# Patient Record
Sex: Male | Born: 1935 | Race: White | Hispanic: No | Marital: Married | Smoking: Former smoker
Health system: Southern US, Community
[De-identification: ages and names within clinical notes are randomized; demographics above are authoritative.]

## PROBLEM LIST (undated history)

## (undated) DIAGNOSIS — J8410 Pulmonary fibrosis, unspecified: Secondary | ICD-10-CM

## (undated) DIAGNOSIS — E119 Type 2 diabetes mellitus without complications: Secondary | ICD-10-CM

## (undated) DIAGNOSIS — J189 Pneumonia, unspecified organism: Secondary | ICD-10-CM

## (undated) DIAGNOSIS — J9621 Acute and chronic respiratory failure with hypoxia: Secondary | ICD-10-CM

## (undated) DIAGNOSIS — I82409 Acute embolism and thrombosis of unspecified deep veins of unspecified lower extremity: Secondary | ICD-10-CM

## (undated) DIAGNOSIS — I2510 Atherosclerotic heart disease of native coronary artery without angina pectoris: Secondary | ICD-10-CM

## (undated) DIAGNOSIS — E079 Disorder of thyroid, unspecified: Secondary | ICD-10-CM

## (undated) DIAGNOSIS — J449 Chronic obstructive pulmonary disease, unspecified: Secondary | ICD-10-CM

## (undated) DIAGNOSIS — E785 Hyperlipidemia, unspecified: Secondary | ICD-10-CM

## (undated) DIAGNOSIS — J4489 Other specified chronic obstructive pulmonary disease: Secondary | ICD-10-CM

## (undated) DIAGNOSIS — I10 Essential (primary) hypertension: Secondary | ICD-10-CM

## (undated) DIAGNOSIS — I219 Acute myocardial infarction, unspecified: Secondary | ICD-10-CM

## (undated) DIAGNOSIS — I5022 Chronic systolic (congestive) heart failure: Secondary | ICD-10-CM

## (undated) DIAGNOSIS — N529 Male erectile dysfunction, unspecified: Secondary | ICD-10-CM

## (undated) HISTORY — PX: CORONARY ARTERY BYPASS GRAFT: SHX141

---

## 2012-07-28 LAB — PSA, DIAGNOSTIC: PSA: 0.1 ng/mL

## 2013-08-01 LAB — PSA, DIAGNOSTIC: PSA: 0.1 ng/mL

## 2014-08-14 ENCOUNTER — Encounter

## 2014-08-14 LAB — PSA, DIAGNOSTIC: PSA: 0.1 ng/mL

## 2014-08-20 LAB — POCT URINALYSIS DIPSTICK W/O MICROSCOPE (AUTO)
Blood, UA POC: NEGATIVE
Glucose, UA POC: NEGATIVE
Leukocytes, UA: NEGATIVE
Nitrite, UA: NEGATIVE
Protein, UA POC: NEGATIVE

## 2014-08-20 NOTE — Progress Notes (Signed)
Tama Headings Sande Brothers, MD FACS    Urology Office Progress Note    Patient:  Craig Gordon  Date of Birth: 08/13/1933  Date: 08/20/2014    HISTORY OF PRESENT ILLNESS:   The patient is a 80 y.o. male who was last seen on 08-09-2013.    Prostate Cancer  Patient is here today for prostate cancer which was first diagnosed several years ago.  His last several PSA values are as follows:  Lab Results   Component Value Date    PSA 0.1 08/14/2014    PSA 0.1 08/01/2013    PSA 0.1 07/28/2012     Previous treatment of prostate cancer: RRP 04/26/00  Lower urinary tract symptoms: frequency and nocturia, 2 times per night  Associated Symptoms: No dysuria, gross hematuria.  Pain Severity: Pain Score:   0 - No pain/10    Summary of old records:   ED: Viagra prn  Left epididymal cyst, 1.2 cm  History of prostate cancer: T1c T2a N0 RRP 04/26/00; bx 03/26/00  History of BNC s/p TUIBNC 07/28/09  Nocturia x2; bothersome, snores a lot    Additional History: No active sexually at this time.  No change in left epididymal cyst.    Procedures Today: N/A    Urinalysis today:  Results for POC orders placed in visit on 08/20/14   POCT URINALYSIS DIPSTICK W/O MICROSCOPE (AUTO)       Result Value Ref Range    Color, UA yellow      Clarity, UA clear      Glucose, UA POC negative      Bilirubin, UA        Ketones, UA        Spec Grav, UA        Blood, UA POC negative      pH, UA        Protein, UA POC negative      Urobilinogen, UA        Leukocytes, UA negative      Nitrite, UA negative       Last several PSA's:  Lab Results   Component Value Date    PSA 0.1 08/14/2014    PSA 0.1 08/01/2013    PSA 0.1 07/28/2012     Last total testosterone:  No results found for this basename: TESTOSTERONE       AUA Symptom Score (08/20/2014):  INCOMPLETE EMPTYING: How often have you had the sensation of not emptying your bladder?: Not at all  FREQUENCY: How often do you have to urinate less than every two hours?: About Half the time  INTERMITTENCY: How often have you found you  stopped and started again several times when you urinated?: Not at all  URGENCY: How often have you found it difficult to postpone urination?: Not at all  WEAK STREAM: How often have you had a weak urinary stream?: Not at all  STRAINING: How often have you had to strain to start  urination?: Not at all  NOCTURIA: How many times did you typically get up at night to uriniate?: 2 Times  TOTAL I-PSS SCORE:: 5  How would you feel if you were to spend the rest of your life with your urinary condition?: Mostly Satisfied    Last AUA Symptom Score (QOL): 4 (2)    Last BUN and creatinine:  No results found for this basename: BUN     No results found for this basename: CREATININE       Additional Lab/Culture results: none  Imaging Reviewed during this Office Visit: none  (results were independently reviewed by physician and radiology report verified)    PAST MEDICAL, FAMILY AND SOCIAL HISTORY UPDATE:  Past Medical History   Diagnosis Date   ??? Erectile dysfunction    ??? Personal history of malignant neoplasm of prostate    ??? Hypertension    ??? Mumps    ??? Hypertrophy of prostate with urinary obstruction and other lower urinary tract symptoms (LUTS)    ??? Caffeine use      3 COFFEE/DAY     Past Surgical History   Procedure Laterality Date   ??? Colonoscopy     ??? Vasectomy     ??? Bladder surgery       TUIBNC 2001   ??? Prostatectomy       2001   ??? Cystoscopy     ??? Prostate biopsy       Family History   Problem Relation Age of Onset   ??? Cancer Father      PROSTATE CANCER     Current Outpatient Prescriptions   Medication Sig Dispense Refill   ??? levothyroxine (SYNTHROID) 100 MCG tablet Take by mouth Daily       ??? amLODIPine (NORVASC) 10 MG tablet Take 10 mg by mouth daily       ??? bisoprolol-hydrochlorothiazide (ZIAC) 2.5-6.25 MG per tablet Take 1 tablet by mouth daily       ??? sildenafil (VIAGRA) 100 MG tablet Take 100 mg by mouth as needed for Erectile Dysfunction         No current facility-administered medications for this visit.        Review of patient's allergies indicates no known allergies.  History   Smoking status   ??? Former Smoker -- 1.00 packs/day for 25 years   Smokeless tobacco   ??? Not on file     Comment: QUIT 1979     (If patient a smoker, smoking cessation counseling offered)    History   Alcohol Use   ??? Yes       REVIEW OF SYSTEMS:  Constitutional: negative  Eyes: negative  Neurological: negative  Endocrine: negative  Gastrointestinal: negative  Cardiovascular: negative  Skin: negative   Musculoskeletal: negative  Ears/Nose/Throat: positive for loss of smell  Respiratory: negative  Hematological/Lymphatic: negative  Psychological: negative    Physical Exam:      Filed Vitals:    08/20/14 1033   BP: 170/82   Pulse: 51     Body mass index is 28.14 kg/(m^2).  Patient is a 80 y.o. male in no acute distress and alert and oriented to person, place and time.      Assessment and Plan      1. Nocturia    2. Impotence of organic origin    3. Epididymal cyst    4. Personal history of malignant neoplasm of prostate           Plan:      Return in about 1 year (around 08/21/2015) for follow up, PSA.  No evidence of prostate cancer recurrence.   No active sexually at this time; no Rx needed for ED.    No change in left epididymal cyst.  Patient was instructed to keep a voiding diary for 3 days in order to document urine output, frequency of urination and functional bladder capacity.  I suspect he has nocturnal polyuria due to obstructive sleep apnea.  If voiding diary confirms this, he will need a sleep evaluation.  Tama Headings Dawt Reeb, MD FACS  08/20/2014 10:56 AM    2015 PQRS Documentation: Patient already has diagnosis of or is currently being treated for Hypertension.Patient does not have an advance plan and does not want to name a durable power of attorney at this time.

## 2015-08-21 LAB — PSA, DIAGNOSTIC: PSA: 0.1 ng/mL

## 2015-08-23 ENCOUNTER — Encounter: Attending: Specialist

## 2015-08-30 ENCOUNTER — Ambulatory Visit: Admit: 2015-08-30 | Discharge: 2015-08-30 | Payer: MEDICARE | Attending: Specialist

## 2015-08-30 DIAGNOSIS — R351 Nocturia: Secondary | ICD-10-CM

## 2015-08-30 LAB — POCT URINALYSIS DIPSTICK W/O MICROSCOPE (AUTO)
Blood, UA POC: NEGATIVE
Glucose, UA POC: NEGATIVE
Leukocytes, UA: NEGATIVE
Nitrite, UA: NEGATIVE
Protein, UA POC: NEGATIVE

## 2015-08-30 NOTE — Progress Notes (Signed)
Tama Headings Sande Brothers, MD FACS    Urology Office Progress Note    Patient:  Craig Gordon  Date of Birth: Jun 05, 1933  Date: 08/30/2015    HISTORY OF PRESENT ILLNESS:   The patient is a 81 y.o. male who was last seen on 08/20/14.    Prostate Cancer  Patient is here today for prostate cancer which was first diagnosed several years ago.  His last several PSA values are as follows:  Lab Results   Component Value Date    PSA 0.1 08/21/2015    PSA 0.1 08/14/2014    PSA 0.1 08/01/2013     Previous treatment of prostate cancer: RRP 04/26/00  Lower urinary tract symptoms: nocturia, 2 times per night  Associated Symptoms: No dysuria, gross hematuria.  Pain Severity: Pain Score:   0 - No pain/10    Summary of old records:   ED: Viagra prn  Left epididymal cyst, 1.2 cm  History of prostate cancer: T1c T2a N0 RRP 04/26/00; bx 03/26/00  History of BNC s/p TUIBNC 07/28/09  Nocturia x2; bothersome, snores a lot; 09/12/14 VD: MVV=450 mL, TVV=1892 mL, TVV=7.33, AVV=258 mL/void, Nx2.33, NPi=44.49%, NUP=142 mL/hr    Additional History: Patient instructed to limit fluid intake 2-3 hours prior to bedtime to minimize nocturia or nocturnal incontinence. Not interested in medical Rx for ED since patient not sexually active.      Procedures Today: N/A    Urinalysis today:  Results for POC orders placed in visit on 08/30/15   POCT Urinalysis No Micro (Auto)   Result Value Ref Range    Color, UA yellow     Clarity, UA clear     Glucose, UA POC negative     Bilirubin, UA      Ketones, UA      Spec Grav, UA      Blood, UA POC negative     pH, UA      Protein, UA POC negative     Urobilinogen, UA      Leukocytes, UA negative     Nitrite, UA negative      Last several PSA's:  Lab Results   Component Value Date    PSA 0.1 08/21/2015    PSA 0.1 08/14/2014    PSA 0.1 08/01/2013     Last total testosterone:  No results found for: TESTOSTERONE    AUA Symptom Score (08/30/2015):  INCOMPLETE EMPTYING: How often have you had the sensation of not emptying your  bladder?: Not at all  FREQUENCY: How often do you have to urinate less than every two hours?: Less than 1 to 5 times  INTERMITTENCY: How often have you found you stopped and started again several times when you urinated?: Not at all  URGENCY: How often have you found it difficult to postpone urination?: Not at all  WEAK STREAM: How often have you had a weak urinary stream?: Not at all  STRAINING: How often have you had to strain to start  urination?: Not at all  NOCTURIA: How many times did you typically get up at night to uriniate?: 2 Times  TOTAL I-PSS SCORE:: 3  How would you feel if you were to spend the rest of your life with your urinary condition?: Pleased    Last AUA Symptom Score (QOL): 5 (2)    Last BUN and creatinine:  No results found for: BUN  No results found for: CREATININE    Additional Lab/Culture results: none    Imaging Reviewed during this  Office Visit: none  (results were independently reviewed by physician and radiology report verified)    PAST MEDICAL, FAMILY AND SOCIAL HISTORY UPDATE:  Past Medical History   Diagnosis Date   ??? Caffeine use      3 COFFEE/DAY   ??? Erectile dysfunction    ??? Hypertension    ??? Hypertrophy of prostate with urinary obstruction and other lower urinary tract symptoms (LUTS)    ??? Mumps    ??? Personal history of malignant neoplasm of prostate      Past Surgical History   Procedure Laterality Date   ??? Colonoscopy     ??? Vasectomy     ??? Bladder surgery       TUIBNC 2001   ??? Prostatectomy       2001   ??? Cystoscopy     ??? Prostate biopsy       Family History   Problem Relation Age of Onset   ??? Cancer Father      PROSTATE CANCER     Current Outpatient Prescriptions   Medication Sig Dispense Refill   ??? levothyroxine (SYNTHROID) 100 MCG tablet Take by mouth Daily     ??? amLODIPine (NORVASC) 10 MG tablet Take 10 mg by mouth daily     ??? bisoprolol-hydrochlorothiazide (ZIAC) 2.5-6.25 MG per tablet Take 1 tablet by mouth daily     ??? sildenafil (VIAGRA) 100 MG tablet Take 100 mg by  mouth as needed for Erectile Dysfunction       No current facility-administered medications for this visit.        Review of patient's allergies indicates no known allergies.  History   Smoking Status   ??? Former Smoker   ??? Packs/day: 1.00   ??? Years: 25.00   Smokeless Tobacco   ??? Not on file     Comment: QUIT 1979     (If patient a smoker, smoking cessation counseling offered)    History   Alcohol Use   ??? Yes       REVIEW OF SYSTEMS:  Constitutional: negative  Eyes: negative  Neurological: negative  Endocrine: negative  Gastrointestinal: negative  Cardiovascular: negative  Skin: negative   Musculoskeletal: negative  Ears/Nose/Throat: positive for loss of smell  Respiratory: negative  Hematological/Lymphatic: negative  Psychological: negative    Physical Exam:      Vitals:    08/30/15 1430   BP: 163/73   Pulse: 51     Body mass index is 27.83 kg/(m^2).  Patient is a 81 y.o. male in no acute distress and alert and oriented to person, place and time.      Assessment and Plan      1. Nocturia    2. Impotence of organic origin    3. Epididymal cyst    4. Personal history of malignant neoplasm of prostate           Plan:      Return in about 1 year (around 08/29/2016) for PSA.  Patient instructed to limit fluid intake 2-3 hours prior to bedtime to minimize nocturia or nocturnal incontinence.   Not interested in medical Rx for ED since patient not sexually active.  No evidence of prostate cancer recurrence.         Tama Headings Craig Meloy, MD FACS  08/30/2015 2:39 PM    2016 PQRS Documentation:   Patient already has diagnosis of or is currently being treated for Hypertension.  Patient does not have an advance plan and does not want  to name a durable power of attorney at this time.

## 2016-08-24 LAB — PSA, DIAGNOSTIC: PSA: 0.1 ng/mL

## 2016-09-02 ENCOUNTER — Ambulatory Visit: Admit: 2016-09-02 | Discharge: 2016-09-02 | Payer: MEDICARE | Attending: Specialist

## 2016-09-02 DIAGNOSIS — R351 Nocturia: Secondary | ICD-10-CM

## 2016-09-02 LAB — POCT URINALYSIS DIPSTICK W/O MICROSCOPE (AUTO)
Blood, UA POC: NEGATIVE
Glucose, UA POC: NEGATIVE
Leukocytes, UA: NEGATIVE
Nitrite, UA: NEGATIVE
Protein, UA POC: NEGATIVE

## 2016-09-02 NOTE — Progress Notes (Signed)
Tama HeadingsGregory D. Sande BrothersHaselhuhn, MD FACS    Urology Office Progress Note    Patient:  Craig DimmerJohnnie Gordon  Date of Birth: 04/20/1933  Date: 09/02/2016    HISTORY OF PRESENT ILLNESS:   The patient is a 82 y.o. male who was last seen on 08/30/15.    Prostate Cancer  Patient is here today for prostate cancer which was first diagnosed several years ago.  He is s/p RRP on 04/26/00.  His last several PSA values are as follows:  Lab Results   Component Value Date    PSA 0.1 08/24/2016    PSA 0.1 08/21/2015    PSA 0.1 08/14/2014     Additional Prostate Cancer Rx: none   Lower urinary tract symptoms: frequency and nocturia, 2 times per night  Stress Urinary Incontinence? No  Number of pads per day: 0   Associated Symptoms: No dysuria, gross hematuria.  Pain Severity: Pain Score:   0 - No pain/10    Summary of old records:   ED: Viagra prn  Left epididymal cyst, 1.2 cm  History of prostate cancer: T1c T2a N0 RRP 04/26/00; bx 03/26/00  History of BNC s/p TUIBNC 07/28/09  Nocturia x2; bothersome, snores a lot; 09/12/14 VD: MVV=450 mL, TVV=1892 mL, TVV=7.33, AVV=258 mL/void, Nx2.33, NPi=44.49%, NUP=142 mL/hr    Additional History: No evidence of prostate cancer recurrence s/p 04/26/00  radical prostatectomy.  At this point, patient does not require any more follow up PSA's.  Patient instructed to limit fluid intake 2-3 hours prior to bedtime to minimize nocturia or nocturnal incontinence.  Not interested in medical Rx for ED since patient not sexually active.     Procedures Today: N/A    Urinalysis today:  Results for POC orders placed in visit on 09/02/16   POCT Urinalysis No Micro (Auto)   Result Value Ref Range    Color, UA Amber     Clarity, UA Clear     Glucose, UA POC Negative     Bilirubin, UA      Ketones, UA      Spec Grav, UA      Blood, UA POC Negative     pH, UA      Protein, UA POC Negative     Urobilinogen, UA      Leukocytes, UA Negative     Nitrite, UA Negative      Last several PSA's:  Lab Results   Component Value Date    PSA 0.1  08/24/2016    PSA 0.1 08/21/2015    PSA 0.1 08/14/2014     Last total testosterone:  No results found for: TESTOSTERONE    AUA Symptom Score (09/02/2016):  INCOMPLETE EMPTYING: How often have you had the sensation of not emptying your bladder?: Not at all  FREQUENCY: How often do you have to urinate less than every two hours?: Less than Half the time  INTERMITTENCY: How often have you found you stopped and started again several times when you urinated?: Not at all  URGENCY: How often have you found it difficult to postpone urination?: Not at all  WEAK STREAM: How often have you had a weak urinary stream?: Not at all  STRAINING: How often have you had to strain to start  urination?: Not at all  NOCTURIA: How many times did you typically get up at night to uriniate?: 2 Times  TOTAL I-PSS SCORE:: 4  How would you feel if you were to spend the rest of your life with your urinary  condition?: Pleased    Last AUA Symptom Score (QOL): 3 (1)    Last BUN and creatinine:  No results found for: BUN  No results found for: CREATININE    Additional Lab/Culture results: none    Imaging Reviewed during this Office Visit: none  (results were independently reviewed by physician and radiology report verified)    PAST MEDICAL, FAMILY AND SOCIAL HISTORY UPDATE:  Past Medical History:   Diagnosis Date   ??? Caffeine use     3 COFFEE/DAY   ??? Erectile dysfunction    ??? Hypertension    ??? Hypertrophy of prostate with urinary obstruction and other lower urinary tract symptoms (LUTS)    ??? Mumps    ??? Personal history of malignant neoplasm of prostate      Past Surgical History:   Procedure Laterality Date   ??? BLADDER SURGERY      TUIBNC 2001   ??? COLONOSCOPY     ??? CYSTOSCOPY     ??? PROSTATE BIOPSY     ??? PROSTATECTOMY      2001   ??? VASECTOMY       Family History   Problem Relation Age of Onset   ??? Cancer Father      PROSTATE CANCER     Current Outpatient Prescriptions   Medication Sig Dispense Refill   ??? fluorouracil (EFUDEX) 5 % cream      ???  levothyroxine (SYNTHROID) 100 MCG tablet Take by mouth Daily     ??? amLODIPine (NORVASC) 10 MG tablet Take 10 mg by mouth daily     ??? bisoprolol-hydrochlorothiazide (ZIAC) 2.5-6.25 MG per tablet Take 1 tablet by mouth daily     ??? sildenafil (VIAGRA) 100 MG tablet Take 100 mg by mouth as needed for Erectile Dysfunction       No current facility-administered medications for this visit.        Review of patient's allergies indicates no known allergies.  History   Smoking Status   ??? Former Smoker   ??? Packs/day: 1.00   ??? Years: 25.00   Smokeless Tobacco   ??? Not on file     Comment: QUIT 1979     (If patient a smoker, smoking cessation counseling offered)    History   Alcohol Use   ??? Yes       REVIEW OF SYSTEMS:  Constitutional: negative  Eyes: positive for  blurred vision  Neurological: negative  Endocrine: negative  Gastrointestinal: negative  Cardiovascular: negative  Skin: positive for pruritus   Musculoskeletal: negative  Ears/Nose/Throat: positive for loss of smell  Respiratory: negative  Hematological/Lymphatic: negative  Psychological: negative    Physical Exam:      Vitals:    09/02/16 1435   BP: (!) 182/78   Pulse: 50     Body mass index is 28.13 kg/(m^2).  Patient is a 82 y.o. male in no acute distress and alert and oriented to person, place and time.      Assessment and Plan      1. Nocturia    2. Impotence of organic origin    3. Personal history of malignant neoplasm of prostate           Plan:      Return if symptoms worsen or fail to improve.  No evidence of prostate cancer recurrence s/p 04/26/00  radical prostatectomy.    At this point, patient does not require any more follow up PSA's.    Patient instructed to limit fluid  intake 2-3 hours prior to bedtime to minimize nocturia or nocturnal incontinence.    Not interested in medical Rx for ED since patient not sexually active.            Tama Headings Craig Lecompte, MD FACS  09/02/2016 2:44 PM    2017 PQRS Documentation:   Patient noted to have have high pressure  as defined as SBP > 140 or DBP > 90 and was referred to PCP for further evaluation and treatment.  Patient does not have an advance plan and does not want to name a durable power of attorney at this time.

## 2018-05-01 ENCOUNTER — Inpatient Hospital Stay
Admission: RE | Admit: 2018-05-01 | Discharge: 2018-05-07 | Disposition: E | Payer: Medicare HMO | Attending: Internal Medicine | Admitting: Internal Medicine

## 2018-05-01 ENCOUNTER — Institutional Professional Consult (permissible substitution): Admit: 2018-05-01 | Payer: Self-pay | Admitting: Internal Medicine

## 2018-05-01 ENCOUNTER — Other Ambulatory Visit (HOSPITAL_COMMUNITY): Payer: Self-pay

## 2018-05-01 DIAGNOSIS — J8410 Pulmonary fibrosis, unspecified: Secondary | ICD-10-CM

## 2018-05-01 DIAGNOSIS — R001 Bradycardia, unspecified: Secondary | ICD-10-CM

## 2018-05-01 DIAGNOSIS — J189 Pneumonia, unspecified organism: Secondary | ICD-10-CM

## 2018-05-01 DIAGNOSIS — I82409 Acute embolism and thrombosis of unspecified deep veins of unspecified lower extremity: Secondary | ICD-10-CM | POA: Diagnosis present

## 2018-05-01 DIAGNOSIS — J449 Chronic obstructive pulmonary disease, unspecified: Secondary | ICD-10-CM | POA: Diagnosis present

## 2018-05-01 DIAGNOSIS — J9621 Acute and chronic respiratory failure with hypoxia: Secondary | ICD-10-CM | POA: Diagnosis present

## 2018-05-01 DIAGNOSIS — J9690 Respiratory failure, unspecified, unspecified whether with hypoxia or hypercapnia: Secondary | ICD-10-CM

## 2018-05-01 DIAGNOSIS — I5033 Acute on chronic diastolic (congestive) heart failure: Secondary | ICD-10-CM | POA: Diagnosis present

## 2018-05-01 DIAGNOSIS — J4489 Other specified chronic obstructive pulmonary disease: Secondary | ICD-10-CM | POA: Diagnosis present

## 2018-05-01 HISTORY — DX: Pulmonary fibrosis, unspecified: J84.10

## 2018-05-01 HISTORY — DX: Chronic systolic (congestive) heart failure: I50.22

## 2018-05-01 HISTORY — DX: Hyperlipidemia, unspecified: E78.5

## 2018-05-01 HISTORY — DX: Disorder of thyroid, unspecified: E07.9

## 2018-05-01 HISTORY — DX: Acute and chronic respiratory failure with hypoxia: J96.21

## 2018-05-01 HISTORY — DX: Other specified chronic obstructive pulmonary disease: J44.89

## 2018-05-01 HISTORY — DX: Pneumonia, unspecified organism: J18.9

## 2018-05-01 HISTORY — DX: Type 2 diabetes mellitus without complications: E11.9

## 2018-05-01 HISTORY — DX: Acute embolism and thrombosis of unspecified deep veins of unspecified lower extremity: I82.409

## 2018-05-01 HISTORY — DX: Chronic obstructive pulmonary disease, unspecified: J44.9

## 2018-05-01 HISTORY — DX: Acute myocardial infarction, unspecified: I21.9

## 2018-05-01 HISTORY — DX: Essential (primary) hypertension: I10

## 2018-05-01 HISTORY — DX: Atherosclerotic heart disease of native coronary artery without angina pectoris: I25.10

## 2018-05-01 LAB — CBC WITH DIFFERENTIAL/PLATELET
ABS IMMATURE GRANULOCYTES: 0.1 10*3/uL (ref 0.0–0.1)
BASOS ABS: 0 10*3/uL (ref 0.0–0.1)
BASOS PCT: 0 %
EOS ABS: 0.3 10*3/uL (ref 0.0–0.7)
Eosinophils Relative: 6 %
HCT: 31.9 % — ABNORMAL LOW (ref 39.0–52.0)
Hemoglobin: 8.9 g/dL — ABNORMAL LOW (ref 13.0–17.0)
Immature Granulocytes: 1 %
Lymphocytes Relative: 18 %
Lymphs Abs: 0.9 10*3/uL (ref 0.7–4.0)
MCH: 25.9 pg — AB (ref 26.0–34.0)
MCHC: 27.9 g/dL — AB (ref 30.0–36.0)
MCV: 92.7 fL (ref 78.0–100.0)
MONO ABS: 0.5 10*3/uL (ref 0.1–1.0)
Monocytes Relative: 10 %
NEUTROS ABS: 3.3 10*3/uL (ref 1.7–7.7)
Neutrophils Relative %: 65 %
PLATELETS: 210 10*3/uL (ref 150–400)
RBC: 3.44 MIL/uL — ABNORMAL LOW (ref 4.22–5.81)
RDW: 15 % (ref 11.5–15.5)
WBC: 5.1 10*3/uL (ref 4.0–10.5)

## 2018-05-01 LAB — BLOOD GAS, ARTERIAL
ACID-BASE EXCESS: 14.6 mmol/L — AB (ref 0.0–2.0)
Bicarbonate: 42.3 mmol/L — ABNORMAL HIGH (ref 20.0–28.0)
FIO2: 70
O2 SAT: 94 %
PATIENT TEMPERATURE: 98.6
pCO2 arterial: 102 mmHg (ref 32.0–48.0)
pH, Arterial: 7.243 — ABNORMAL LOW (ref 7.350–7.450)
pO2, Arterial: 75.4 mmHg — ABNORMAL LOW (ref 83.0–108.0)

## 2018-05-01 LAB — HEMOGLOBIN A1C
HEMOGLOBIN A1C: 5.4 % (ref 4.8–5.6)
Mean Plasma Glucose: 108.28 mg/dL

## 2018-05-01 LAB — PROTIME-INR
INR: 2.07
PROTHROMBIN TIME: 23.1 s — AB (ref 11.4–15.2)

## 2018-05-01 MED ORDER — INSULIN LISPRO 100 UNIT/ML ~~LOC~~ SOLN
1.00 | SUBCUTANEOUS | Status: DC
Start: 2018-05-01 — End: 2018-05-01

## 2018-05-01 MED ORDER — LEVOTHYROXINE SODIUM 25 MCG PO TABS
25.00 | ORAL_TABLET | ORAL | Status: DC
Start: 2018-05-02 — End: 2018-05-01

## 2018-05-01 MED ORDER — WARFARIN SODIUM 1 MG PO TABS
ORAL_TABLET | ORAL | Status: DC
Start: ? — End: 2018-05-01

## 2018-05-01 MED ORDER — NIACIN ER (ANTIHYPERLIPIDEMIC) 500 MG PO TBCR
500.00 | EXTENDED_RELEASE_TABLET | ORAL | Status: DC
Start: 2018-05-01 — End: 2018-05-01

## 2018-05-01 MED ORDER — WARFARIN SODIUM 3 MG PO TABS
3.00 | ORAL_TABLET | ORAL | Status: DC
Start: 2018-05-01 — End: 2018-05-01

## 2018-05-01 MED ORDER — GENERIC EXTERNAL MEDICATION
Status: DC
Start: ? — End: 2018-05-01

## 2018-05-01 MED ORDER — TAMSULOSIN HCL 0.4 MG PO CAPS
0.80 | ORAL_CAPSULE | ORAL | Status: DC
Start: 2018-05-02 — End: 2018-05-01

## 2018-05-01 MED ORDER — CLOTRIMAZOLE 1 % EX CREA
TOPICAL_CREAM | CUTANEOUS | Status: DC
Start: 2018-05-01 — End: 2018-05-01

## 2018-05-01 MED ORDER — INSULIN GLARGINE 100 UNIT/ML ~~LOC~~ SOLN
1.00 | SUBCUTANEOUS | Status: DC
Start: 2018-05-01 — End: 2018-05-01

## 2018-05-01 MED ORDER — FAMOTIDINE 20 MG PO TABS
20.00 | ORAL_TABLET | ORAL | Status: DC
Start: 2018-05-01 — End: 2018-05-01

## 2018-05-01 MED ORDER — FUROSEMIDE 40 MG PO TABS
40.00 | ORAL_TABLET | ORAL | Status: DC
Start: 2018-05-02 — End: 2018-05-01

## 2018-05-01 MED ORDER — INSULIN LISPRO 100 UNIT/ML ~~LOC~~ SOLN
1.00 | SUBCUTANEOUS | Status: DC
Start: ? — End: 2018-05-01

## 2018-05-01 MED ORDER — VITAMIN D3 25 MCG (1000 UNIT) PO TABS
1000.00 | ORAL_TABLET | ORAL | Status: DC
Start: 2018-05-02 — End: 2018-05-01

## 2018-05-01 MED ORDER — POTASSIUM CHLORIDE 20 MEQ/50ML IV SOLN
20.00 | INTRAVENOUS | Status: DC
Start: ? — End: 2018-05-01

## 2018-05-01 MED ORDER — PRAVASTATIN SODIUM 40 MG PO TABS
40.00 | ORAL_TABLET | ORAL | Status: DC
Start: 2018-05-01 — End: 2018-05-01

## 2018-05-01 MED ORDER — SODIUM CHLORIDE 0.9 % IV SOLN
10.00 | INTRAVENOUS | Status: DC
Start: ? — End: 2018-05-01

## 2018-05-01 MED ORDER — POTASSIUM CHLORIDE CRYS ER 20 MEQ PO TBCR
20.00 | EXTENDED_RELEASE_TABLET | ORAL | Status: DC
Start: ? — End: 2018-05-01

## 2018-05-01 MED ORDER — POTASSIUM CHLORIDE 20 MEQ/15ML (10%) PO SOLN
20.00 | ORAL | Status: DC
Start: ? — End: 2018-05-01

## 2018-05-02 ENCOUNTER — Other Ambulatory Visit (HOSPITAL_COMMUNITY): Payer: Self-pay

## 2018-05-02 ENCOUNTER — Encounter: Payer: Self-pay | Admitting: Internal Medicine

## 2018-05-02 DIAGNOSIS — J189 Pneumonia, unspecified organism: Secondary | ICD-10-CM

## 2018-05-02 DIAGNOSIS — J9621 Acute and chronic respiratory failure with hypoxia: Secondary | ICD-10-CM | POA: Diagnosis not present

## 2018-05-02 DIAGNOSIS — I82409 Acute embolism and thrombosis of unspecified deep veins of unspecified lower extremity: Secondary | ICD-10-CM | POA: Diagnosis present

## 2018-05-02 DIAGNOSIS — J8410 Pulmonary fibrosis, unspecified: Secondary | ICD-10-CM | POA: Diagnosis not present

## 2018-05-02 DIAGNOSIS — I824Y9 Acute embolism and thrombosis of unspecified deep veins of unspecified proximal lower extremity: Secondary | ICD-10-CM | POA: Diagnosis not present

## 2018-05-02 DIAGNOSIS — J449 Chronic obstructive pulmonary disease, unspecified: Secondary | ICD-10-CM | POA: Diagnosis present

## 2018-05-02 DIAGNOSIS — I5033 Acute on chronic diastolic (congestive) heart failure: Secondary | ICD-10-CM | POA: Diagnosis not present

## 2018-05-02 HISTORY — DX: Pneumonia, unspecified organism: J18.9

## 2018-05-02 HISTORY — DX: Pulmonary fibrosis, unspecified: J84.10

## 2018-05-02 LAB — BASIC METABOLIC PANEL
ANION GAP: 8 (ref 5–15)
BUN: 21 mg/dL — ABNORMAL HIGH (ref 6–20)
CHLORIDE: 93 mmol/L — AB (ref 101–111)
CO2: 41 mmol/L — AB (ref 22–32)
Calcium: 9.2 mg/dL (ref 8.9–10.3)
Creatinine, Ser: 1.31 mg/dL — ABNORMAL HIGH (ref 0.61–1.24)
GFR calc non Af Amer: 49 mL/min — ABNORMAL LOW (ref >60)
GFR, EST AFRICAN AMERICAN: 57 mL/min — AB (ref >60)
Glucose, Bld: 241 mg/dL — ABNORMAL HIGH (ref 65–99)
POTASSIUM: 4.2 mmol/L (ref 3.5–5.1)
SODIUM: 142 mmol/L (ref 135–145)

## 2018-05-02 LAB — HEMOGLOBIN A1C
Hgb A1c MFr Bld: 5.3 % (ref 4.8–5.6)
Mean Plasma Glucose: 105.41 mg/dL

## 2018-05-02 LAB — COMPREHENSIVE METABOLIC PANEL
ALT: 12 U/L — ABNORMAL LOW (ref 17–63)
ANION GAP: 3 — AB (ref 5–15)
AST: 14 U/L — ABNORMAL LOW (ref 15–41)
Albumin: 2.2 g/dL — ABNORMAL LOW (ref 3.5–5.0)
Alkaline Phosphatase: 49 U/L (ref 38–126)
BUN: 21 mg/dL — ABNORMAL HIGH (ref 6–20)
CHLORIDE: 95 mmol/L — AB (ref 101–111)
CO2: 44 mmol/L — ABNORMAL HIGH (ref 22–32)
Calcium: 9.3 mg/dL (ref 8.9–10.3)
Creatinine, Ser: 1.23 mg/dL (ref 0.61–1.24)
GFR, EST NON AFRICAN AMERICAN: 53 mL/min — AB (ref >60)
Glucose, Bld: 178 mg/dL — ABNORMAL HIGH (ref 65–99)
POTASSIUM: 4.4 mmol/L (ref 3.5–5.1)
Sodium: 142 mmol/L (ref 135–145)
Total Bilirubin: 0.4 mg/dL (ref 0.3–1.2)
Total Protein: 5.2 g/dL — ABNORMAL LOW (ref 6.5–8.1)

## 2018-05-02 LAB — PROTIME-INR
INR: 2.1
Prothrombin Time: 23.4 seconds — ABNORMAL HIGH (ref 11.4–15.2)

## 2018-05-02 LAB — CBC
HCT: 32.3 % — ABNORMAL LOW (ref 39.0–52.0)
HEMOGLOBIN: 8.8 g/dL — AB (ref 13.0–17.0)
MCH: 25.8 pg — ABNORMAL LOW (ref 26.0–34.0)
MCHC: 27.2 g/dL — AB (ref 30.0–36.0)
MCV: 94.7 fL (ref 78.0–100.0)
Platelets: 207 10*3/uL (ref 150–400)
RBC: 3.41 MIL/uL — AB (ref 4.22–5.81)
RDW: 14.9 % (ref 11.5–15.5)
WBC: 6 10*3/uL (ref 4.0–10.5)

## 2018-05-02 LAB — CBC WITH DIFFERENTIAL/PLATELET
Abs Immature Granulocytes: 0 10*3/uL (ref 0.0–0.1)
BASOS ABS: 0 10*3/uL (ref 0.0–0.1)
Basophils Relative: 0 %
EOS ABS: 0.3 10*3/uL (ref 0.0–0.7)
EOS PCT: 6 %
HEMATOCRIT: 30.8 % — AB (ref 39.0–52.0)
Hemoglobin: 8.4 g/dL — ABNORMAL LOW (ref 13.0–17.0)
Immature Granulocytes: 1 %
LYMPHS ABS: 0.7 10*3/uL (ref 0.7–4.0)
Lymphocytes Relative: 16 %
MCH: 25.6 pg — ABNORMAL LOW (ref 26.0–34.0)
MCHC: 27.3 g/dL — AB (ref 30.0–36.0)
MCV: 93.9 fL (ref 78.0–100.0)
MONO ABS: 0.4 10*3/uL (ref 0.1–1.0)
Monocytes Relative: 8 %
Neutro Abs: 3.1 10*3/uL (ref 1.7–7.7)
Neutrophils Relative %: 69 %
Platelets: 185 10*3/uL (ref 150–400)
RBC: 3.28 MIL/uL — AB (ref 4.22–5.81)
RDW: 14.8 % (ref 11.5–15.5)
WBC: 4.5 10*3/uL (ref 4.0–10.5)

## 2018-05-02 LAB — CK TOTAL AND CKMB (NOT AT ARMC)
CK TOTAL: 33 U/L — AB (ref 49–397)
CK, MB: 1.6 ng/mL (ref 0.5–5.0)
RELATIVE INDEX: INVALID (ref 0.0–2.5)

## 2018-05-02 LAB — MAGNESIUM: MAGNESIUM: 2 mg/dL (ref 1.7–2.4)

## 2018-05-02 LAB — TROPONIN I

## 2018-05-02 LAB — TSH: TSH: 4.997 u[IU]/mL — AB (ref 0.350–4.500)

## 2018-05-02 LAB — PHOSPHORUS: Phosphorus: 3.7 mg/dL (ref 2.5–4.6)

## 2018-05-02 LAB — T4, FREE: Free T4: 0.76 ng/dL — ABNORMAL LOW (ref 0.82–1.77)

## 2018-05-02 MED ORDER — GENERIC EXTERNAL MEDICATION
Status: DC
Start: ? — End: 2018-05-02

## 2018-05-02 NOTE — Progress Notes (Signed)
Pulmonary Critical Care Medicine [facility] [place]  PULMONARY SERVICE  Date of Service: 05/02/2018  PULMONARY CONSULT   Peter Wilkins  AVW:[id number]  DOB: [birth year]   DOA: 04/09/2018  Referring Physician: Carron Curie, MD  HPI: Peter Wilkins is a 84 y.o. male seen for follow up of Acute on Chronic Respiratory Failure.  Patient has multiple medical problems including chronic oxygen dependence coronary artery disease COPD presented to the hospital with increasing shortness of breath.  Patient was also found to have decompensated heart failure.  In addition was diagnosed with pneumonia treated with antibiotics.  Patient further had complications associated with pleural effusion requiring a thoracentesis and this was complicated with a pneumothorax.  Patient has been treated with high-dose Lasix for heart failure and was also given Rocephin for the pneumonia.  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  Cardiology recommendations were to discontinue the amiodarone.  Patient has been requiring BiPAP for ventilatory support.  Currently has been on 60% oxygen.  Status has been somewhat tenuous from a pulmonary perspective.  This is mainly secondary to high oxygen requirements at this time.  Review of Systems:  ROS performed and is unremarkable other than noted above.  Past Medical History:  Diagnosis Date  . Acute on chronic respiratory failure with hypoxia (HCC)   . Bilateral pneumonia 05/02/2018  . Chronic obstructive asthma (HCC)   . Chronic systolic heart failure (HCC)   . COPD (chronic obstructive pulmonary disease) (HCC)   . Coronary artery disease   . Diabetes mellitus (HCC)   . DVT, lower extremity (HCC)   . Hyperlipidemia   . Hypertension   . Myocardial infarction (HCC)   . 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 Thyroid disease     Past Surgical History:   Procedure Laterality Date  . CORONARY ARTERY BYPASS GRAFT      Social History:    reports that he has quit smoking. He has never used smokeless tobacco. He reports that he drank alcohol. He reports that he has current or past drug history.  Family History: Non-Contributory to the present illness  Allergies not on file  Medications: Reviewed on Rounds  Physical Exam:  Vitals: Temperature 97.6 pulse 85 respiratory rate 23 blood pressure 114/60 saturations 97%  Ventilator Settings currently is off the ventilator has been on BiPAP 20/10 FiO2 60%  . General: Comfortable at this time . Eyes: Grossly normal lids, irises & conjunctiva . ENT: grossly tongue is normal . Neck: no obvious mass . Cardiovascular: S1-S2 normal no gallop or rub was noted . Respiratory: Coarse breath sounds with few scattered rhonchi . Abdomen: Soft and nontender . Skin: no rash seen on limited exam . Musculoskeletal: not rigid . Psychiatric:unable to assess . Neurologic: no seizure no involuntary movements         Labs on Admission:  Basic Metabolic Panel: Recent Labs  Lab 05/02/18 0440 05/02/18 0932  NA 142 142  K 4.4 4.2  CL 95* 93*  CO2 44* 41*  GLUCOSE 178* 241*  BUN 21* 21*  CREATININE 1.23 1.31*  CALCIUM 9.3 9.2  MG 2.0  --   PHOS 3.7  --     Liver Function Tests: Recent Labs  Lab 05/02/18 0440  AST 14*  ALT 12*  ALKPHOS 49  BILITOT 0.4  PROT 5.2*  ALBUMIN 2.2*   No results for input(s): LIPASE, AMYLASE in the last 168  hours. No results for input(s): AMMONIA in the last 168 hours.  CBC: Recent Labs  Lab 04/09/2018 1613 05/02/18 0440 05/02/18 0932  WBC 5.1 4.5 6.0  NEUTROABS 3.3 3.1  --   HGB 8.9* 8.4* 8.8*  HCT 31.9* 30.8* 32.3*  MCV 92.7 93.9 94.7  PLT 210 185 207    Cardiac Enzymes: Recent Labs  Lab 05/02/18 0932  CKTOTAL 33*  CKMB 1.6  TROPONINI <0.03    BNP (last 3 results) No results for input(s): BNP in the last 8760 hours.  ProBNP (last 3  results) No results for input(s): PROBNP in the last 8760 hours.   Radiological Exams on Admission: Dg Chest Port 1 View  Result Date: 05/02/2018 CLINICAL DATA:  Bradycardia. EXAM: PORTABLE CHEST 1 VIEW COMPARISON:  04/23/2018 FINDINGS: Bilateral interstitial and airspace lung opacities are without change from the previous exam. No new lung abnormalities. Probable small pleural effusions. No pneumothorax. Stable changes from prior CABG surgery. IMPRESSION: 1. No significant change from the prior study. 2. Bilateral interstitial and airspace lung opacities. Findings may reflect pulmonary edema or multifocal pneumonia. Chronic interstitial fibrosis may reflect a component of the lung opacities. Consider further assessment with chest CT. Electronically Signed   By: Amie Portland M.D.   On: 05/02/2018 09:59   Dg Chest Port 1 View  Result Date: 04/29/2018 CLINICAL DATA:  Respiratory failure. EXAM: PORTABLE CHEST 1 VIEW COMPARISON:  None. FINDINGS: Poor inspiration. Bilateral interstitial and alveolar opacities with obscuration of the heart borders. No gross cardiac enlargement. Post CABG changes. Small amount of right lateral pleural thickening or fluid. Thoracic spine degenerative changes. Cervical spine degenerative changes. IMPRESSION: 1. Mild bilateral airspace opacities, greater on the right, suspicious for pneumonia. 2. Diffuse interstitial lung disease. This could be chronic, acute pneumonitis or a combination of both. 3. Small amount of right lateral pleural thickening or fluid. Electronically Signed   By: Beckie Salts M.D.   On: 04/06/2018 16:43    Assessment/Plan Active Problems:   Acute on chronic respiratory failure with hypoxia (HCC)   DVT, lower extremity (HCC)   COPD (chronic obstructive pulmonary disease) (HCC)   Chronic obstructive asthma (HCC)   Acute on chronic diastolic CHF (congestive heart failure) (HCC)   Pulmonary fibrosis, postinflammatory (HCC)   Bilateral  pneumonia   1. Acute on chronic respiratory failure with hypoxia we will continue with BiPAP 20/10 we will continue secretion management pulmonary toilet.  Patient still requiring high oxygen levels at this time.  The last chest x-ray that was done showed bilateral linear opacities and diffuse interstitial disease.  This would be consistent with pulmonary toxicity of amiodarone which was previously noted. 2. COPD apparently has severe disease we will continue with current management patient has been on oxygen reportedly. 3. Acute on chronic diastolic heart failure diuresis tolerated monitor fluid status monitor renal function also. 4. Pulmonary fibrosis as noted secondary to amiodarone which has been discontinued we will continue with supportive care. 5. Bilateral pneumonia treated with antibiotics 6. DVT has been treated we will continue to monitor  I have personally seen and evaluated the patient, evaluated laboratory and imaging results, formulated the assessment and plan and placed orders. The Patient requires high complexity decision making for assessment and support.  Case was discussed on Rounds with the Respiratory Therapy Staff Time Spent review of the medical record labs x-rays as well as coordination of care with the primary care physician  Yevonne Pax, MD [facility] Pulmonary Critical Care Medicine Sleep  Medicine

## 2018-05-03 ENCOUNTER — Other Ambulatory Visit (HOSPITAL_COMMUNITY): Payer: Self-pay

## 2018-05-03 DIAGNOSIS — J449 Chronic obstructive pulmonary disease, unspecified: Secondary | ICD-10-CM | POA: Diagnosis not present

## 2018-05-03 DIAGNOSIS — J9621 Acute and chronic respiratory failure with hypoxia: Secondary | ICD-10-CM

## 2018-05-03 DIAGNOSIS — J189 Pneumonia, unspecified organism: Secondary | ICD-10-CM

## 2018-05-03 DIAGNOSIS — J8410 Pulmonary fibrosis, unspecified: Secondary | ICD-10-CM

## 2018-05-03 DIAGNOSIS — I824Y9 Acute embolism and thrombosis of unspecified deep veins of unspecified proximal lower extremity: Secondary | ICD-10-CM

## 2018-05-03 LAB — CK TOTAL AND CKMB (NOT AT ARMC)
CK TOTAL: 242 U/L (ref 49–397)
CK, MB: 9.1 ng/mL — ABNORMAL HIGH (ref 0.5–5.0)
Relative Index: 3.8 — ABNORMAL HIGH (ref 0.0–2.5)

## 2018-05-03 LAB — CBC
HEMATOCRIT: 32 % — AB (ref 39.0–52.0)
HEMOGLOBIN: 8.9 g/dL — AB (ref 13.0–17.0)
MCH: 25.9 pg — AB (ref 26.0–34.0)
MCHC: 27.8 g/dL — AB (ref 30.0–36.0)
MCV: 93.3 fL (ref 78.0–100.0)
Platelets: 209 10*3/uL (ref 150–400)
RBC: 3.43 MIL/uL — ABNORMAL LOW (ref 4.22–5.81)
RDW: 15.1 % (ref 11.5–15.5)
WBC: 5.8 10*3/uL (ref 4.0–10.5)

## 2018-05-03 LAB — RENAL FUNCTION PANEL
ALBUMIN: 2.4 g/dL — AB (ref 3.5–5.0)
ANION GAP: 9 (ref 5–15)
BUN: 26 mg/dL — AB (ref 6–20)
CO2: 41 mmol/L — ABNORMAL HIGH (ref 22–32)
Calcium: 9.6 mg/dL (ref 8.9–10.3)
Chloride: 95 mmol/L — ABNORMAL LOW (ref 101–111)
Creatinine, Ser: 1.53 mg/dL — ABNORMAL HIGH (ref 0.61–1.24)
GFR calc Af Amer: 47 mL/min — ABNORMAL LOW (ref >60)
GFR calc non Af Amer: 41 mL/min — ABNORMAL LOW (ref >60)
GLUCOSE: 168 mg/dL — AB (ref 65–99)
POTASSIUM: 4.3 mmol/L (ref 3.5–5.1)
Phosphorus: 2.5 mg/dL (ref 2.5–4.6)
SODIUM: 145 mmol/L (ref 135–145)

## 2018-05-03 LAB — MAGNESIUM
MAGNESIUM: 2.1 mg/dL (ref 1.7–2.4)
Magnesium: 2 mg/dL (ref 1.7–2.4)

## 2018-05-03 LAB — TROPONIN I: Troponin I: 0.06 ng/mL (ref <0.03)

## 2018-05-03 LAB — BRAIN NATRIURETIC PEPTIDE: B NATRIURETIC PEPTIDE 5: 314 pg/mL — AB (ref 0.0–100.0)

## 2018-05-03 LAB — PROTIME-INR
INR: 2.22
Prothrombin Time: 24.4 seconds — ABNORMAL HIGH (ref 11.4–15.2)

## 2018-05-03 LAB — PHOSPHORUS: Phosphorus: 3.8 mg/dL (ref 2.5–4.6)

## 2018-05-03 MED ORDER — GENERIC EXTERNAL MEDICATION
Status: DC
Start: ? — End: 2018-05-03

## 2018-05-03 MED FILL — Medication: Qty: 1 | Status: AC

## 2018-05-03 NOTE — Progress Notes (Signed)
Pulmonary Critical Care Medicine [facility] [place]   PULMONARY SERVICE  PROGRESS NOTE  Date of Service: 05/03/2018  Peter Wilkins  ZOX:[id number]  DOB: Dec 21, 1932   DOA: 04/09/2018  Referring Physician: Carron Curie, MD  HPI: Peter Wilkins is a 84 y.o. male seen for follow up of Acute on Chronic Respiratory Failure.  Patient right now is on BiPAP 20/10 was tried off of the BiPAP did not tolerate.  Patient is a DNR so therefore not a candidate for intubation.  Medications: Reviewed on Rounds  Physical Exam:  Vitals: Temperature 96.2 pulse 72 respiratory rate 29 blood pressure 111/64 saturations are 97%  Ventilator Settings currently on BiPAP 20/10 FiO2 60%  . General: Comfortable at this time . Eyes: Grossly normal lids, irises & conjunctiva . ENT: grossly tongue is normal . Neck: no obvious mass . Cardiovascular: S1 S2 normal no gallop . Respiratory: Coarse scattered rhonchi noted bilaterally . Abdomen: soft . Skin: no rash seen on limited exam . Musculoskeletal: not rigid . Psychiatric:unable to assess . Neurologic: no seizure no involuntary movements         Labs on Admission:  Basic Metabolic Panel: Recent Labs  Lab 05/02/18 0440 05/02/18 0932 05/03/18 0500  NA 142 142 145  K 4.4 4.2 4.3  CL 95* 93* 95*  CO2 44* 41* 41*  GLUCOSE 178* 241* 168*  BUN 21* 21* 26*  CREATININE 1.23 1.31* 1.53*  CALCIUM 9.3 9.2 9.6  MG 2.0  --  2.1  PHOS 3.7  --  2.5    Liver Function Tests: Recent Labs  Lab 05/02/18 0440 05/03/18 0500  AST 14*  --   ALT 12*  --   ALKPHOS 49  --   BILITOT 0.4  --   PROT 5.2*  --   ALBUMIN 2.2* 2.4*   No results for input(s): LIPASE, AMYLASE in the last 168 hours. No results for input(s): AMMONIA in the last 168 hours.  CBC: Recent Labs  Lab 04/17/2018 1613 05/02/18 0440 05/02/18 0932 05/03/18 0819  WBC 5.1 4.5 6.0 5.8  NEUTROABS 3.3 3.1  --   --   HGB 8.9* 8.4* 8.8* 8.9*  HCT 31.9* 30.8* 32.3* 32.0*  MCV 92.7  93.9 94.7 93.3  PLT 210 185 207 209    Cardiac Enzymes: Recent Labs  Lab 05/02/18 0932  CKTOTAL 33*  CKMB 1.6  TROPONINI <0.03    BNP (last 3 results) Recent Labs    05/03/18 0826  BNP 314.0*    ProBNP (last 3 results) No results for input(s): PROBNP in the last 8760 hours.  Radiological Exams on Admission: Dg Chest Port 1 View  Result Date: 05/02/2018 CLINICAL DATA:  Bradycardia. EXAM: PORTABLE CHEST 1 VIEW COMPARISON:  04/10/2018 FINDINGS: Bilateral interstitial and airspace lung opacities are without change from the previous exam. No new lung abnormalities. Probable small pleural effusions. No pneumothorax. Stable changes from prior CABG surgery. IMPRESSION: 1. No significant change from the prior study. 2. Bilateral interstitial and airspace lung opacities. Findings may reflect pulmonary edema or multifocal pneumonia. Chronic interstitial fibrosis may reflect a component of the lung opacities. Consider further assessment with chest CT. Electronically Signed   By: Amie Portland M.D.   On: 05/02/2018 09:59   Dg Chest Port 1 View  Result Date: 05/03/2018 CLINICAL DATA:  Respiratory failure. EXAM: PORTABLE CHEST 1 VIEW COMPARISON:  None. FINDINGS: Poor inspiration. Bilateral interstitial and alveolar opacities with obscuration of the heart borders. No gross cardiac enlargement. Post CABG  changes. Small amount of right lateral pleural thickening or fluid. Thoracic spine degenerative changes. Cervical spine degenerative changes. IMPRESSION: 1. Mild bilateral airspace opacities, greater on the right, suspicious for pneumonia. 2. Diffuse interstitial lung disease. This could be chronic, acute pneumonitis or a combination of both. 3. Small amount of right lateral pleural thickening or fluid. Electronically Signed   By: Beckie Salts M.D.   On: 04/14/2018 16:43    Assessment/Plan Active Problems:   Acute on chronic respiratory failure with hypoxia (HCC)   DVT, lower extremity (HCC)    COPD (chronic obstructive pulmonary disease) (HCC)   Chronic obstructive asthma (HCC)   Acute on chronic diastolic CHF (congestive heart failure) (HCC)   Pulmonary fibrosis, postinflammatory (HCC)   Bilateral pneumonia   1. Acute on chronic respiratory failure with hypoxia we will continue secretion management pulmonary toilet patient is not a candidate for intubation has been requiring BiPAP almost continuously. 2. COPD severe disease we will continue to follow along 3. Pulmonary fibrosis severe disease not a candidate for any intervention at this time.  Bilateral pneumonia has been treated 4. DVT continue supportive care   I have personally seen and evaluated the patient, evaluated laboratory and imaging results, formulated the assessment and plan and placed orders. The Patient requires high complexity decision making for assessment and support.  Case was discussed on Rounds with the Respiratory Therapy Staff  Yevonne Pax, MD [facility] Pulmonary Critical Care Medicine Sleep Medicine

## 2018-05-04 DIAGNOSIS — I824Y9 Acute embolism and thrombosis of unspecified deep veins of unspecified proximal lower extremity: Secondary | ICD-10-CM | POA: Diagnosis not present

## 2018-05-04 DIAGNOSIS — J449 Chronic obstructive pulmonary disease, unspecified: Secondary | ICD-10-CM | POA: Diagnosis not present

## 2018-05-04 DIAGNOSIS — J189 Pneumonia, unspecified organism: Secondary | ICD-10-CM | POA: Diagnosis not present

## 2018-05-04 DIAGNOSIS — J9621 Acute and chronic respiratory failure with hypoxia: Secondary | ICD-10-CM | POA: Diagnosis not present

## 2018-05-04 LAB — RENAL FUNCTION PANEL
ANION GAP: 9 (ref 5–15)
Albumin: 2.2 g/dL — ABNORMAL LOW (ref 3.5–5.0)
BUN: 35 mg/dL — ABNORMAL HIGH (ref 6–20)
CALCIUM: 9.3 mg/dL (ref 8.9–10.3)
CO2: 37 mmol/L — ABNORMAL HIGH (ref 22–32)
CREATININE: 1.85 mg/dL — AB (ref 0.61–1.24)
Chloride: 98 mmol/L — ABNORMAL LOW (ref 101–111)
GFR, EST AFRICAN AMERICAN: 37 mL/min — AB (ref >60)
GFR, EST NON AFRICAN AMERICAN: 32 mL/min — AB (ref >60)
Glucose, Bld: 236 mg/dL — ABNORMAL HIGH (ref 65–99)
PHOSPHORUS: 4.2 mg/dL (ref 2.5–4.6)
Potassium: 4.3 mmol/L (ref 3.5–5.1)
SODIUM: 144 mmol/L (ref 135–145)

## 2018-05-04 LAB — CBC
HCT: 29 % — ABNORMAL LOW (ref 39.0–52.0)
HEMOGLOBIN: 8 g/dL — AB (ref 13.0–17.0)
MCH: 25.7 pg — AB (ref 26.0–34.0)
MCHC: 27.6 g/dL — ABNORMAL LOW (ref 30.0–36.0)
MCV: 93.2 fL (ref 78.0–100.0)
PLATELETS: 168 10*3/uL (ref 150–400)
RBC: 3.11 MIL/uL — AB (ref 4.22–5.81)
RDW: 14.9 % (ref 11.5–15.5)
WBC: 5.1 10*3/uL (ref 4.0–10.5)

## 2018-05-04 LAB — PROTIME-INR
INR: 2.39
PROTHROMBIN TIME: 25.9 s — AB (ref 11.4–15.2)

## 2018-05-04 LAB — TROPONIN I
TROPONIN I: 0.06 ng/mL — AB (ref <0.03)
Troponin I: 0.05 ng/mL (ref <0.03)

## 2018-05-04 LAB — MAGNESIUM: MAGNESIUM: 2.1 mg/dL (ref 1.7–2.4)

## 2018-05-04 NOTE — Progress Notes (Signed)
Pulmonary Critical Care Medicine [facility] [place]   PULMONARY SERVICE  PROGRESS NOTE  Date of Service: 05/04/2018  Peter Wilkins  WUJ:[id number]  DOB: Aug 30, 1933   DOA: 04/29/2018  Referring Physician: Carron Curie, MD  HPI: Peter Wilkins is a 84 y.o. male seen for follow up of Acute on Chronic Respiratory Failure.  He is doing poorly has been on BiPAP 20/10 continuously.  Currently is on 90% oxygen.  The patient's son was present in the room and he was updated.  Medications: Reviewed on Rounds  Physical Exam:  Vitals: Temperature 97.0 pulse 76 respiratory rate 25 blood pressure 127/64 saturations 97%  Ventilator Settings BiPAP 20/10 FiO2 90%  . General: Comfortable at this time . Eyes: Grossly normal lids, irises & conjunctiva . ENT: grossly tongue is normal . Neck: no obvious mass . Cardiovascular: S1 S2 normal no gallop . Respiratory: Coarse breath sounds expansion is equal . Abdomen: soft . Skin: no rash seen on limited exam . Musculoskeletal: not rigid . Psychiatric:unable to assess . Neurologic: no seizure no involuntary movements         Labs on Admission:  Basic Metabolic Panel: Recent Labs  Lab 05/02/18 0440 05/02/18 0932 05/03/18 0500 05/03/18 2157 05/04/18 0057 05/04/18 0805  NA 142 142 145  --  144  --   K 4.4 4.2 4.3  --  4.3  --   CL 95* 93* 95*  --  98*  --   CO2 44* 41* 41*  --  37*  --   GLUCOSE 178* 241* 168*  --  236*  --   BUN 21* 21* 26*  --  35*  --   CREATININE 1.23 1.31* 1.53*  --  1.85*  --   CALCIUM 9.3 9.2 9.6  --  9.3  --   MG 2.0  --  2.1 2.0  --  2.1  PHOS 3.7  --  2.5 3.8 4.2  --     Liver Function Tests: Recent Labs  Lab 05/02/18 0440 05/03/18 0500 05/04/18 0057  AST 14*  --   --   ALT 12*  --   --   ALKPHOS 49  --   --   BILITOT 0.4  --   --   PROT 5.2*  --   --   ALBUMIN 2.2* 2.4* 2.2*   No results for input(s): LIPASE, AMYLASE in the last 168 hours. No results for input(s): AMMONIA in the last 168  hours.  CBC: Recent Labs  Lab 04/21/2018 1613 05/02/18 0440 05/02/18 0932 05/03/18 0819 05/04/18 0805  WBC 5.1 4.5 6.0 5.8 5.1  NEUTROABS 3.3 3.1  --   --   --   HGB 8.9* 8.4* 8.8* 8.9* 8.0*  HCT 31.9* 30.8* 32.3* 32.0* 29.0*  MCV 92.7 93.9 94.7 93.3 93.2  PLT 210 185 207 209 168    Cardiac Enzymes: Recent Labs  Lab 05/02/18 0932 05/03/18 2157 05/04/18 0057 05/04/18 0805  CKTOTAL 33* 242  --   --   CKMB 1.6 9.1*  --   --   TROPONINI <0.03 0.06* 0.06* 0.05*    BNP (last 3 results) Recent Labs    05/03/18 0826  BNP 314.0*    ProBNP (last 3 results) No results for input(s): PROBNP in the last 8760 hours.  Radiological Exams on Admission: Dg Chest Port 1 View  Result Date: 05/03/2018 CLINICAL DATA:  Bradycardia post code blue EXAM: PORTABLE CHEST 1 VIEW COMPARISON:  05/02/2018 FINDINGS: Progression of severe  diffuse bilateral airspace disease. Probable small pleural effusions. Prior CABG. No pneumothorax IMPRESSION: Extensive bilateral airspace disease with mild progression. Possible pneumonia. Probable small bilateral effusions. Electronically Signed   By: Marlan Palau M.D.   On: 05/03/2018 20:53   Dg Chest Port 1 View  Result Date: 05/02/2018 CLINICAL DATA:  Bradycardia. EXAM: PORTABLE CHEST 1 VIEW COMPARISON:  04/27/2018 FINDINGS: Bilateral interstitial and airspace lung opacities are without change from the previous exam. No new lung abnormalities. Probable small pleural effusions. No pneumothorax. Stable changes from prior CABG surgery. IMPRESSION: 1. No significant change from the prior study. 2. Bilateral interstitial and airspace lung opacities. Findings may reflect pulmonary edema or multifocal pneumonia. Chronic interstitial fibrosis may reflect a component of the lung opacities. Consider further assessment with chest CT. Electronically Signed   By: Amie Portland M.D.   On: 05/02/2018 09:59   Dg Chest Port 1 View  Result Date: 04/23/2018 CLINICAL DATA:   Respiratory failure. EXAM: PORTABLE CHEST 1 VIEW COMPARISON:  None. FINDINGS: Poor inspiration. Bilateral interstitial and alveolar opacities with obscuration of the heart borders. No gross cardiac enlargement. Post CABG changes. Small amount of right lateral pleural thickening or fluid. Thoracic spine degenerative changes. Cervical spine degenerative changes. IMPRESSION: 1. Mild bilateral airspace opacities, greater on the right, suspicious for pneumonia. 2. Diffuse interstitial lung disease. This could be chronic, acute pneumonitis or a combination of both. 3. Small amount of right lateral pleural thickening or fluid. Electronically Signed   By: Beckie Salts M.D.   On: 04/26/2018 16:43    Assessment/Plan Active Problems:   Acute on chronic respiratory failure with hypoxia (HCC)   DVT, lower extremity (HCC)   COPD (chronic obstructive pulmonary disease) (HCC)   Chronic obstructive asthma (HCC)   Acute on chronic diastolic CHF (congestive heart failure) (HCC)   Pulmonary fibrosis, postinflammatory (HCC)   Bilateral pneumonia   1. Acute on chronic respiratory failure with hypoxia we will continue with supportive care as noted patient's overall prognosis quite poor from underlying pulmonary fibrosis.  Currently is a limited code patient should be a full no code.  His son is going to talk to him about the DNR status with the patient's wife 2. COPD severe disease we will continue present management 3. Pulmonary fibrosis end-stage terminal 4. Bilateral pneumonia treated with antibiotics 5. DVT at baseline   I have personally seen and evaluated the patient, evaluated laboratory and imaging results, formulated the assessment and plan and placed orders. The Patient requires high complexity decision making for assessment and support.  Case was discussed on Rounds with the Respiratory Therapy Staff  Yevonne Pax, MD [facility] Pulmonary Critical Care Medicine Sleep Medicine

## 2018-05-05 LAB — CBC
HEMATOCRIT: 31 % — AB (ref 39.0–52.0)
HEMOGLOBIN: 8.4 g/dL — AB (ref 13.0–17.0)
MCH: 25.5 pg — AB (ref 26.0–34.0)
MCHC: 27.1 g/dL — ABNORMAL LOW (ref 30.0–36.0)
MCV: 93.9 fL (ref 78.0–100.0)
Platelets: 205 10*3/uL (ref 150–400)
RBC: 3.3 MIL/uL — AB (ref 4.22–5.81)
RDW: 14.8 % (ref 11.5–15.5)
WBC: 6.5 10*3/uL (ref 4.0–10.5)

## 2018-05-05 LAB — RENAL FUNCTION PANEL
Albumin: 2.3 g/dL — ABNORMAL LOW (ref 3.5–5.0)
Anion gap: 9 (ref 5–15)
BUN: 45 mg/dL — ABNORMAL HIGH (ref 6–20)
CALCIUM: 9.2 mg/dL (ref 8.9–10.3)
CHLORIDE: 100 mmol/L — AB (ref 101–111)
CO2: 34 mmol/L — ABNORMAL HIGH (ref 22–32)
Creatinine, Ser: 2.36 mg/dL — ABNORMAL HIGH (ref 0.61–1.24)
GFR, EST AFRICAN AMERICAN: 28 mL/min — AB (ref >60)
GFR, EST NON AFRICAN AMERICAN: 24 mL/min — AB (ref >60)
Glucose, Bld: 237 mg/dL — ABNORMAL HIGH (ref 65–99)
Phosphorus: 5.2 mg/dL — ABNORMAL HIGH (ref 2.5–4.6)
Potassium: 4.9 mmol/L (ref 3.5–5.1)
SODIUM: 143 mmol/L (ref 135–145)

## 2018-05-05 LAB — MAGNESIUM: MAGNESIUM: 2.2 mg/dL (ref 1.7–2.4)

## 2018-05-05 MED FILL — Medication: Qty: 1 | Status: AC

## 2018-05-07 DEATH — deceased

## 2019-03-06 IMAGING — DX DG CHEST 1V PORT
1 series · 1 of 1 positions shown · non-contrast
Comparison: 05/01/2018

CLINICAL DATA: Bradycardia.

EXAM:
PORTABLE CHEST 1 VIEW

[chest]
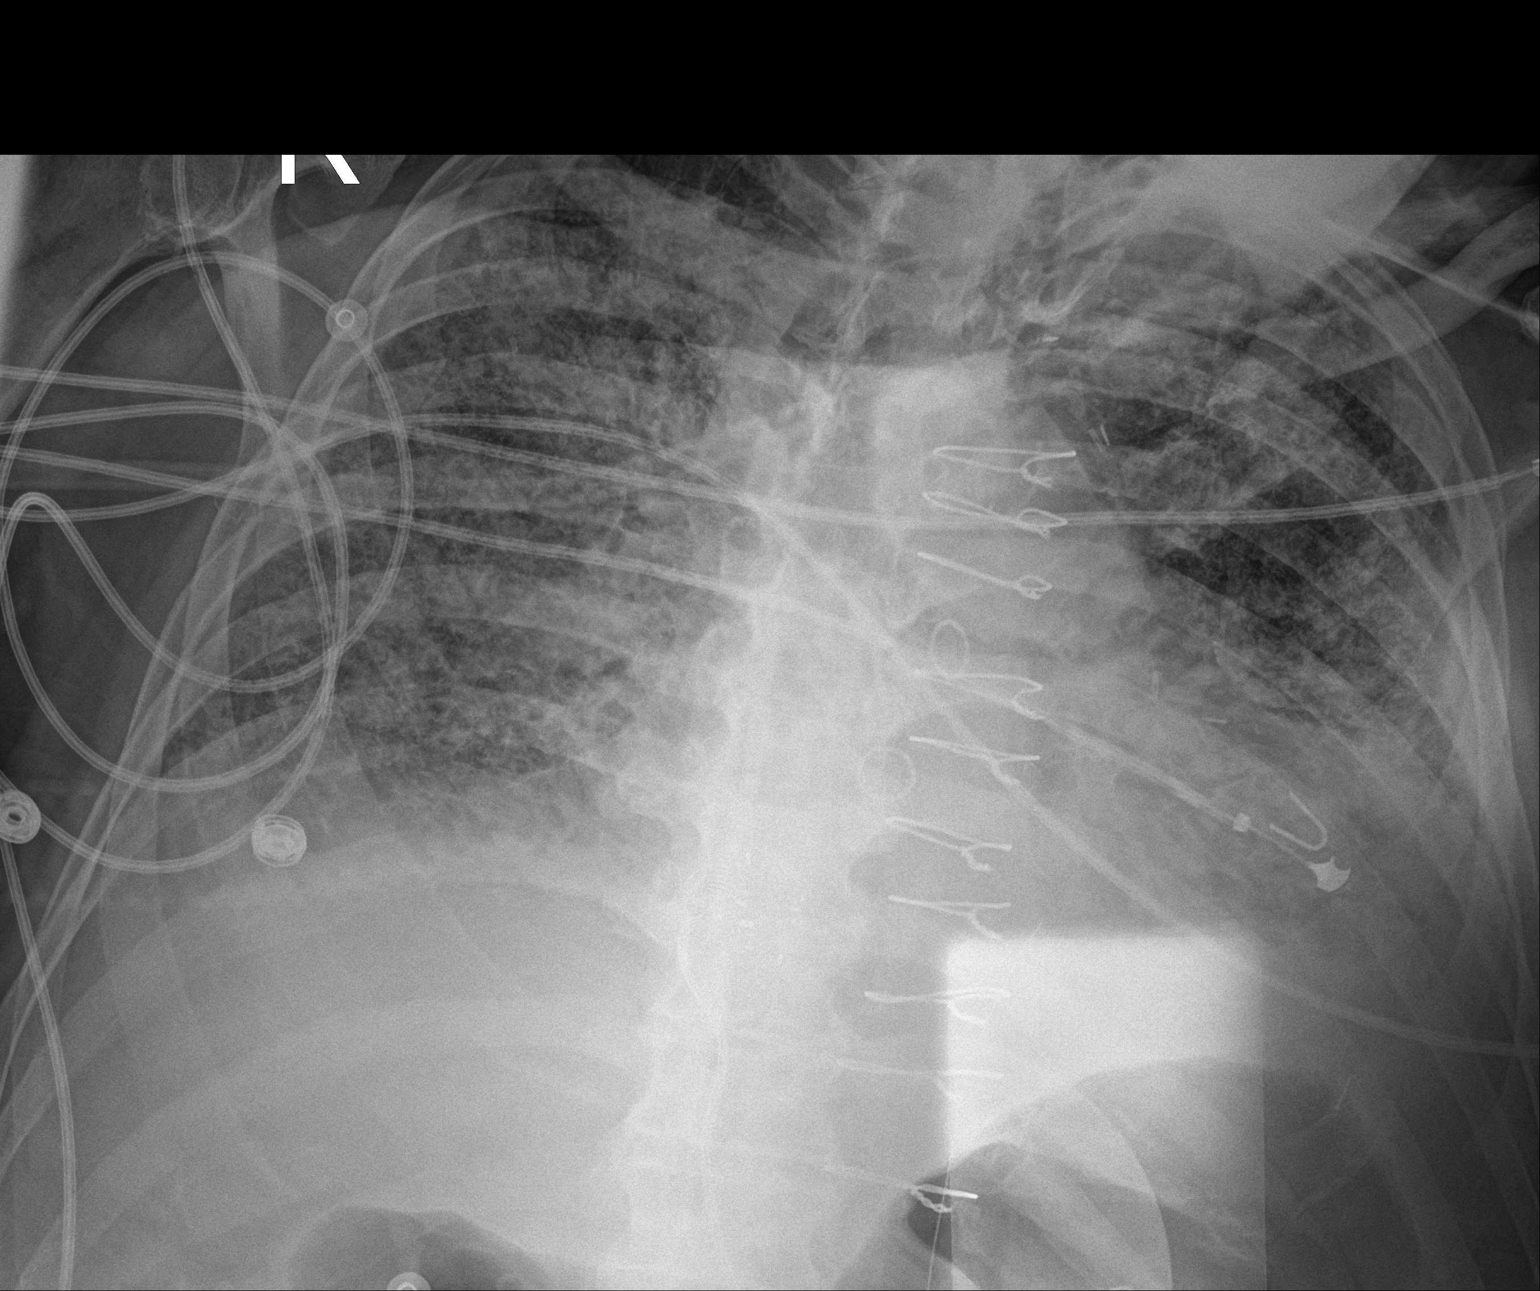

[1 of 1 positions shown; findings below may reference images not displayed]

FINDINGS: Bilateral interstitial and airspace lung opacities are without
change from the previous exam. No new lung abnormalities. Probable
small pleural effusions. No pneumothorax.

Stable changes from prior CABG surgery.
IMPRESSION: 1. No significant change from the prior study.
2. Bilateral interstitial and airspace lung opacities. Findings may
reflect pulmonary edema or multifocal pneumonia. Chronic
interstitial fibrosis may reflect a component of the lung opacities.
Consider further assessment with chest CT.

## 2019-03-07 IMAGING — DX DG CHEST 1V PORT
1 series · 1 of 1 positions shown · non-contrast
Comparison: 05/02/2018

CLINICAL DATA: Bradycardia post code blue

EXAM:
PORTABLE CHEST 1 VIEW

[chest]
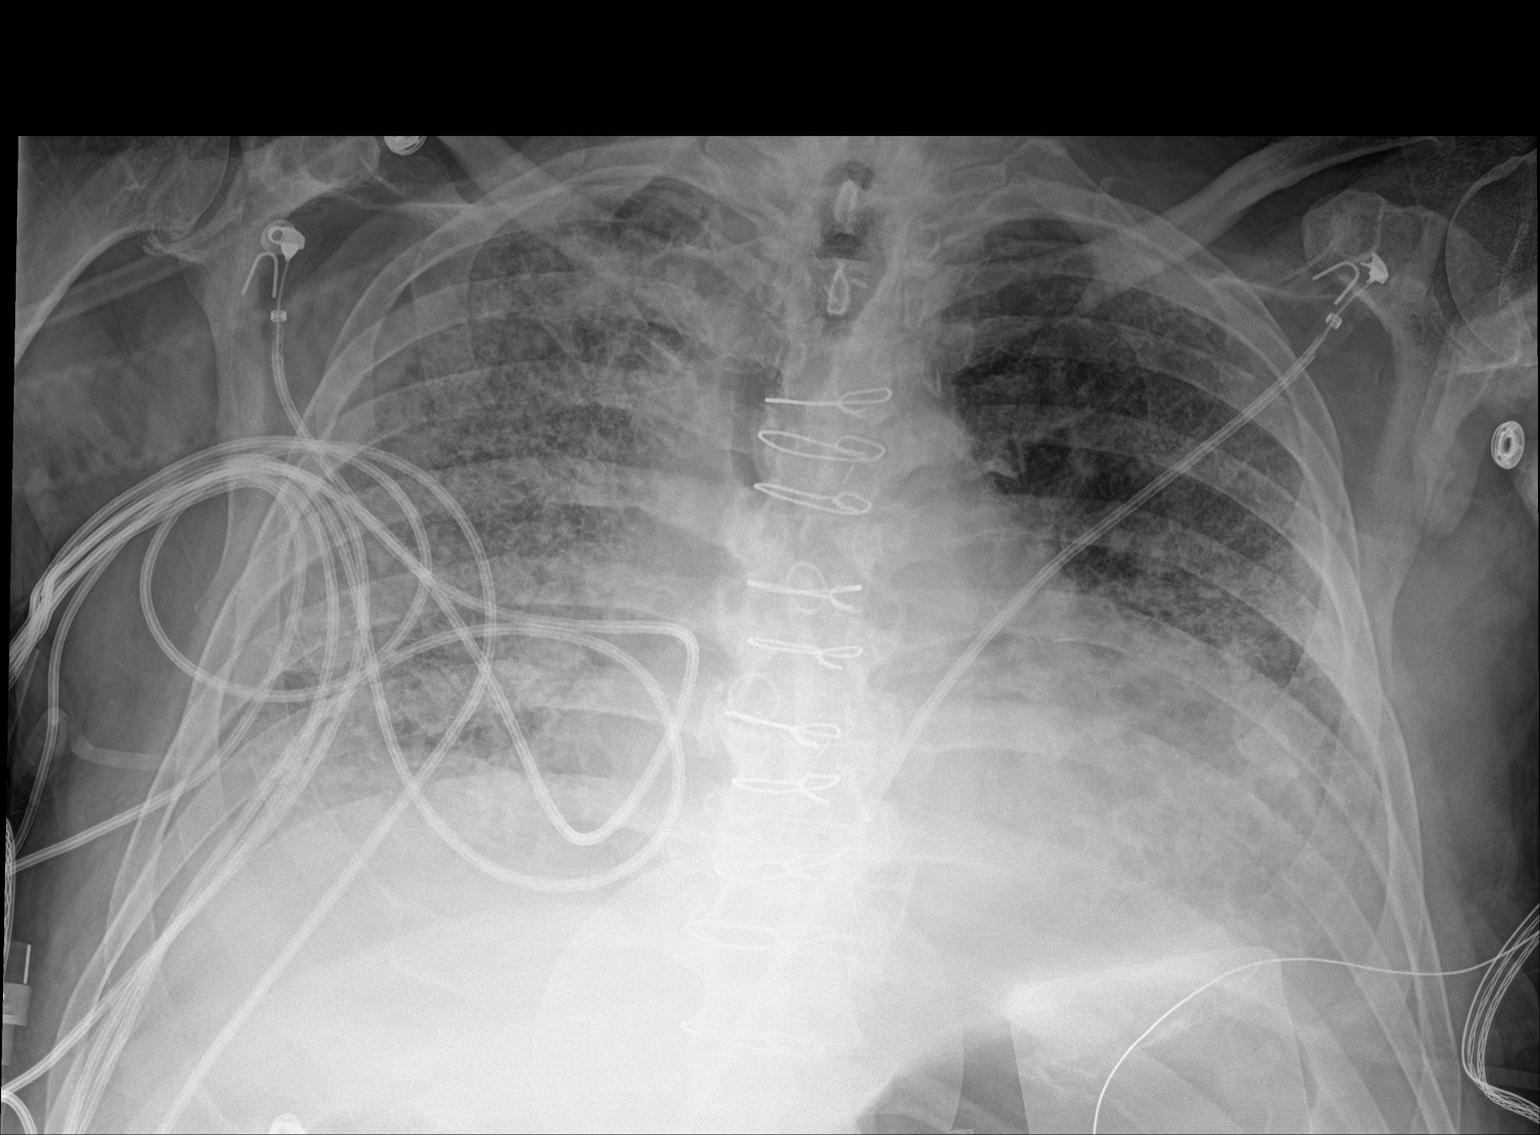

[1 of 1 positions shown; findings below may reference images not displayed]

FINDINGS: Progression of severe diffuse bilateral airspace disease. Probable
small pleural effusions. Prior CABG. No pneumothorax
IMPRESSION: Extensive bilateral airspace disease with mild progression. Possible
pneumonia. Probable small bilateral effusions.
